# Patient Record
Sex: Male | Born: 1995 | Race: Black or African American | Hispanic: No | Marital: Single | Smoking: Current every day smoker
Health system: Southern US, Community
[De-identification: ages and names within clinical notes are randomized; demographics above are authoritative.]

---

## 2018-07-18 ENCOUNTER — Ambulatory Visit
Admission: EM | Admit: 2018-07-18 | Discharge: 2018-07-18 | Disposition: A | Discharge status: Home / Self Care | Payer: Federal, State, Local not specified - PPO | Attending: Physician Assistant | Admitting: Physician Assistant

## 2018-07-18 DIAGNOSIS — Z202 Contact with and (suspected) exposure to infections with a predominantly sexual mode of transmission: Secondary | ICD-10-CM

## 2018-07-18 NOTE — ED Provider Notes (Signed)
[name]    CSN: [id number] Arrival date & time: 07/18/18  1143     History   Chief Complaint Chief Complaint  Patient presents with  . SEXUALLY TRANSMITTED DISEASE    HPI Tyler Singleton is a 23 y.o. male.   23 year old male comes in for STD testing.  Patient states his partner went for a regular checkup, and was told to have an infection.  He is unsure what the infection is.  However, he is asymptomatic.  Denies urinary symptoms such as frequency, dysuria, hematuria.  Denies abdominal pain, nausea, vomiting.  Denies fever, chills, night sweats.  Denies penile discharge, penile lesion, testicular swelling, testicular pain.  Sexually active with one male partner, no condom use.     History reviewed. No pertinent past medical history.  There are no active problems to display for this patient.   History reviewed. No pertinent surgical history.     Home Medications    Prior to Admission medications   Not on File    Family History No family history on file.  Social History Social History   Tobacco Use  . Smoking status: Never Smoker  . Smokeless tobacco: Never Used  Substance Use Topics  . Alcohol use: Not Currently  . Drug use: Not on file     Allergies   Patient has no known allergies.   Review of Systems Review of Systems  Reason unable to perform ROS: See HPI as above.     Physical Exam Triage Vital Signs ED Triage Vitals  Enc Vitals Group     BP 07/18/18 1204 125/72     Pulse Rate 07/18/18 1204 85     Resp 07/18/18 1204 12     Temp 07/18/18 1204 98.6 F (37 C)     Temp Source 07/18/18 1204 Oral     SpO2 07/18/18 1204 99 %     Weight --      Height --      Head Circumference --      Peak Flow --      Pain Score 07/18/18 1212 0     Pain Loc --      Pain Edu? --      Excl. in [place]? --    No data found.  Updated Vital Signs BP 125/72 (BP Location: Left Arm)   Pulse 85   Temp 98.6 F (37 C) (Oral)   Resp 12   SpO2  99%   Physical Exam Constitutional:      General: He is not in acute distress.    Appearance: He is well-developed. He is not diaphoretic.  HENT:     Head: Normocephalic and atraumatic.  Eyes:     Conjunctiva/sclera: Conjunctivae normal.     Pupils: Pupils are equal, round, and reactive to light.  Neurological:     Mental Status: He is alert and oriented to person, place, and time.      UC Treatments / Results  Labs (all labs ordered are listed, but only abnormal results are displayed) Labs Reviewed  [place]    EKG   Radiology No results found.  Procedures Procedures (including critical care time)  Medications Ordered in UC Medications - No data to display  Initial Impression / Assessment and Plan / UC Course  I have reviewed the triage vital signs and the nursing notes.  Pertinent labs & imaging results that were available during my care of the patient were reviewed by me and considered  in my medical decision making (see chart for details).     Cytology sent, patient will be contacted with any positive results that require additional treatment. Patient to refrain from sexual activity for the next 7 days. Return precautions given.   Final Clinical Impressions(s) / UC Diagnoses   Final diagnoses:  Possible exposure to STD    ED Prescriptions    None       Belinda FisherYu, Miaisabella Bacorn V, PA-C 07/18/18 1254

## 2018-07-18 NOTE — Discharge Instructions (Signed)
Cytology sent, you will be contacted with any positive results that requires further treatment. Refrain from sexual activity for the next 7 days. If you have any testicular pain, penile lesion, follow up for reevaluation needed.

## 2018-07-19 LAB — URINE CYTOLOGY ANCILLARY ONLY
Chlamydia: POSITIVE — AB
Neisseria Gonorrhea: NEGATIVE
Trichomonas: NEGATIVE

## 2018-07-20 ENCOUNTER — Telehealth (HOSPITAL_COMMUNITY): Payer: Self-pay | Admitting: Emergency Medicine

## 2018-07-20 MED ORDER — AZITHROMYCIN 250 MG PO TABS: 1000.0000 mg | ORAL_TABLET | Freq: Once | ORAL | 0 refills | Status: AC

## 2018-07-20 NOTE — Telephone Encounter (Signed)
Chlamydia is positive.  Rx po zithromax 1g #1 dose no refills was sent to the pharmacy of record.  Pt needs education to please refrain from sexual intercourse for 7 days to give the medicine time to work, sexual partners need to be notified and tested/treated.  Condoms may reduce risk of reinfection.  Recheck or followup with PCP for further evaluation if symptoms are not improving.   [facility] notified.  Patient contacted and made aware of all results, all questions answered.    

## 2018-08-04 ENCOUNTER — Ambulatory Visit
Admission: EM | Admit: 2018-08-04 | Discharge: 2018-08-04 | Disposition: A | Discharge status: Home / Self Care | Payer: Federal, State, Local not specified - PPO | Attending: Emergency Medicine | Admitting: Emergency Medicine

## 2018-08-04 ENCOUNTER — Other Ambulatory Visit: Payer: Self-pay

## 2018-08-04 DIAGNOSIS — R369 Urethral discharge, unspecified: Secondary | ICD-10-CM | POA: Insufficient documentation

## 2018-08-04 MED ORDER — CEFTRIAXONE SODIUM 250 MG IJ SOLR
250.0000 mg | Freq: Once | INTRAMUSCULAR | Status: AC
  Administered 2018-08-04: 250 mg via INTRAMUSCULAR

## 2018-08-04 MED ORDER — AZITHROMYCIN 500 MG PO TABS
1000.0000 mg | ORAL_TABLET | Freq: Once | ORAL | Status: AC
  Administered 2018-08-04: 1000 mg via ORAL

## 2018-08-04 NOTE — Discharge Instructions (Addendum)
You were treated today for both chlamydia and gonorrhea. Avoid all forms of sexual intercourse (oral, vaginal, anal) for the next 7 days to avoid spreading/reinfecting. Return if symptoms worsen/do not resolve, you develop fever, abdominal pain, blood in your urine, or are re-exposed to an STI.

## 2018-08-04 NOTE — ED Provider Notes (Addendum)
[name]    CSN: [id number] Arrival date & time: 08/04/18  9381     History   Chief Complaint Chief Complaint  Patient presents with  . SEXUALLY TRANSMITTED DISEASE    HPI Tyler Singleton is a 23 y.o. male presenting for 1 day course of penile discharge without penile pain, swelling, testicular pain or swelling, abdominal pain.  Patient currently sexually active, with back to same male partner had previously when he tested positive for chlamydia.  Patient was treated for this earlier in July, though states that he was sexually active again with this partner since then, who states that she tested positive for chlamydia again.    History reviewed. No pertinent past medical history.  There are no active problems to display for this patient.   History reviewed. No pertinent surgical history.     Home Medications    Prior to Admission medications   Not on File    Family History No family history on file.  Social History Social History   Tobacco Use  . Smoking status: Never Smoker  . Smokeless tobacco: Never Used  Substance Use Topics  . Alcohol use: Not Currently  . Drug use: Not on file     Allergies   Patient has no known allergies.   Review of Systems Review of Systems  Constitutional: Negative for fatigue and fever.  Respiratory: Negative for cough and shortness of breath.   Cardiovascular: Negative for chest pain and palpitations.  Gastrointestinal: Negative for abdominal pain, diarrhea and vomiting.  Genitourinary: Positive for discharge. Negative for frequency, penile pain, penile swelling, scrotal swelling and testicular pain.  Musculoskeletal: Negative for arthralgias and myalgias.  Skin: Negative for rash and wound.  Neurological: Negative for speech difficulty and headaches.  All other systems reviewed and are negative.    Physical Exam Triage Vital Signs ED Triage Vitals  Enc Vitals Group     BP 08/04/18 0948 (!) 148/89      Pulse Rate 08/04/18 0948 85     Resp 08/04/18 0948 18     Temp 08/04/18 0948 98 F (36.7 C)     Temp Source 08/04/18 0948 Oral     SpO2 08/04/18 0948 99 %     Weight --      Height --      Head Circumference --      Peak Flow --      Pain Score 08/04/18 0949 0     Pain Loc --      Pain Edu? --      Excl. in [place]? --    No data found.  Updated Vital Signs BP (!) 148/89 (BP Location: Left Arm)   Pulse 85   Temp 98 F (36.7 C) (Oral)   Resp 18   SpO2 99%   Visual Acuity Right Eye Distance:   Left Eye Distance:   Bilateral Distance:    Right Eye Near:   Left Eye Near:    Bilateral Near:     Physical Exam Constitutional:      General: He is not in acute distress. HENT:     Head: Normocephalic and atraumatic.  Eyes:     General: No scleral icterus.    Pupils: Pupils are equal, round, and reactive to light.  Cardiovascular:     Rate and Rhythm: Normal rate.  Pulmonary:     Effort: Pulmonary effort is normal.  Skin:    Coloration: Skin is not jaundiced or pale.  Neurological:  Mental Status: He is alert and oriented to person, place, and time.      UC Treatments / Results  Labs (all labs ordered are listed, but only abnormal results are displayed) Labs Reviewed  URINE CYTOLOGY ANCILLARY ONLY    EKG   Radiology No results found.  Procedures Procedures (including critical care time)  Medications Ordered in UC Medications  azithromycin (ZITHROMAX) tablet 1,000 mg (has no administration in time range)  cefTRIAXone (ROCEPHIN) injection 250 mg (has no administration in time range)    Initial Impression / Assessment and Plan / UC Course  I have reviewed the triage vital signs and the nursing notes.  Pertinent labs & imaging results that were available during my care of the patient were reviewed by me and considered in my medical decision making (see chart for details).     1.  Penile discharge Patient with exposure to chlamydia, reporting  discharge: Given IM Rocephin and p.o. azithromycin which he tolerated well.  Discussed safe sex precautions as listed below.  Return precautions discussed, patient verbalized understanding and is agreeable to plan. Final Clinical Impressions(s) / UC Diagnoses   Final diagnoses:  Penile discharge     Discharge Instructions     You were treated today for both chlamydia and gonorrhea. Avoid all forms of sexual intercourse (oral, vaginal, anal) for the next 7 days to avoid spreading/reinfecting. Return if symptoms worsen/do not resolve, you develop fever, abdominal pain, blood in your urine, or are re-exposed to an STI.    ED Prescriptions    None     Controlled Substance Prescriptions [place] Controlled Substance Registry consulted? Not Applicable   Odette FractionHall-Potvin, [place], PA-C 08/04/18 1005    Hall-Potvin, [place], [place] JerseyPA-C 08/04/18 1006

## 2018-08-04 NOTE — ED Triage Notes (Signed)
Pt c/o penile discharge and states feels like it did last month when he was positive for STD

## 2018-08-06 ENCOUNTER — Emergency Department (HOSPITAL_COMMUNITY)
Admission: EM | Admit: 2018-08-06 | Discharge: 2018-08-06 | Disposition: A | Discharge status: Home / Self Care | Payer: Federal, State, Local not specified - PPO | Attending: Emergency Medicine | Admitting: Emergency Medicine

## 2018-08-06 ENCOUNTER — Encounter (HOSPITAL_COMMUNITY): Payer: Self-pay

## 2018-08-06 ENCOUNTER — Emergency Department (HOSPITAL_COMMUNITY): Payer: Federal, State, Local not specified - PPO

## 2018-08-06 ENCOUNTER — Other Ambulatory Visit: Payer: Self-pay

## 2018-08-06 DIAGNOSIS — S20212A Contusion of left front wall of thorax, initial encounter: Secondary | ICD-10-CM

## 2018-08-06 DIAGNOSIS — M79632 Pain in left forearm: Secondary | ICD-10-CM | POA: Insufficient documentation

## 2018-08-06 DIAGNOSIS — Y939 Activity, unspecified: Secondary | ICD-10-CM | POA: Insufficient documentation

## 2018-08-06 DIAGNOSIS — Y92410 Unspecified street and highway as the place of occurrence of the external cause: Secondary | ICD-10-CM | POA: Insufficient documentation

## 2018-08-06 DIAGNOSIS — F17290 Nicotine dependence, other tobacco product, uncomplicated: Secondary | ICD-10-CM | POA: Diagnosis not present

## 2018-08-06 DIAGNOSIS — Y999 Unspecified external cause status: Secondary | ICD-10-CM | POA: Insufficient documentation

## 2018-08-06 DIAGNOSIS — S29001A Unspecified injury of muscle and tendon of front wall of thorax, initial encounter: Secondary | ICD-10-CM | POA: Diagnosis present

## 2018-08-06 NOTE — ED Notes (Signed)
Patient verbalizes understanding of discharge instructions. Opportunity for questioning and answers were provided. Armband removed by staff, pt discharged from ED.  
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
## 2018-08-08 LAB — URINE CYTOLOGY ANCILLARY ONLY
Chlamydia: NEGATIVE
Neisseria Gonorrhea: NEGATIVE
Trichomonas: NEGATIVE

## 2018-08-13 ENCOUNTER — Other Ambulatory Visit: Payer: Self-pay

## 2018-08-13 ENCOUNTER — Emergency Department (HOSPITAL_COMMUNITY)
Admission: EM | Admit: 2018-08-13 | Discharge: 2018-08-13 | Disposition: A | Discharge status: Home / Self Care | Payer: Federal, State, Local not specified - PPO | Attending: Emergency Medicine | Admitting: Emergency Medicine

## 2018-08-13 ENCOUNTER — Encounter (HOSPITAL_COMMUNITY): Payer: Self-pay | Admitting: Emergency Medicine

## 2018-08-13 DIAGNOSIS — Y999 Unspecified external cause status: Secondary | ICD-10-CM | POA: Insufficient documentation

## 2018-08-13 DIAGNOSIS — Y92410 Unspecified street and highway as the place of occurrence of the external cause: Secondary | ICD-10-CM | POA: Diagnosis not present

## 2018-08-13 DIAGNOSIS — M546 Pain in thoracic spine: Secondary | ICD-10-CM | POA: Insufficient documentation

## 2018-08-13 DIAGNOSIS — Y9389 Activity, other specified: Secondary | ICD-10-CM | POA: Diagnosis not present

## 2018-08-13 DIAGNOSIS — F17290 Nicotine dependence, other tobacco product, uncomplicated: Secondary | ICD-10-CM | POA: Diagnosis not present

## 2018-08-13 DIAGNOSIS — M7918 Myalgia, other site: Secondary | ICD-10-CM

## 2018-08-13 MED ORDER — METHOCARBAMOL 500 MG PO TABS: 500.0000 mg | ORAL_TABLET | Freq: Two times a day (BID) | ORAL | 0 refills | Status: DC

## 2018-08-13 NOTE — Discharge Instructions (Signed)
Please read attached information. If you experience any new or worsening signs or symptoms please return to the emergency room for evaluation. Please follow-up with your primary care provider or specialist as discussed. Please use medication prescribed only as directed and discontinue taking if you have any concerning signs or symptoms.   °
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
## 2018-11-12 ENCOUNTER — Ambulatory Visit
Admission: EM | Admit: 2018-11-12 | Discharge: 2018-11-12 | Disposition: A | Discharge status: Home / Self Care | Payer: Federal, State, Local not specified - PPO | Attending: Physician Assistant | Admitting: Physician Assistant

## 2018-11-12 ENCOUNTER — Other Ambulatory Visit: Payer: Self-pay

## 2018-11-12 DIAGNOSIS — Z113 Encounter for screening for infections with a predominantly sexual mode of transmission: Secondary | ICD-10-CM | POA: Insufficient documentation

## 2018-11-12 NOTE — Discharge Instructions (Addendum)
Cytology sent, you will be contacted with any positive results that requires further treatment. Refrain from sexual activity for the next 7 days. If developing testicular swelling/pain, penile lesion/sore, follow up for reevaluation.   

## 2018-11-12 NOTE — ED Triage Notes (Signed)
Pt requesting a STD testing, denies any sx's

## 2018-11-12 NOTE — ED Provider Notes (Signed)
[name]    CSN: [id number] Arrival date & time: 11/12/18  0857      History   Chief Complaint Chief Complaint  Patient presents with  . SEXUALLY TRANSMITTED DISEASE    HPI Tyler Singleton is a 23 y.o. male.   23 year old male comes in for STD testing. He is asymptomatic, states his partner is having vaginal discharge and would like to be checked. Denies urinary symptoms such as frequency, dysuria, hematuria.  Denies penile discharge, penile lesion, testicular swelling, testicular pain.  Denies abdominal pain, nausea, vomiting.  Denies fever, chills, body aches.  He is sexually active with one male partner, no consistent condom use.     History reviewed. No pertinent past medical history.  There are no active problems to display for this patient.   History reviewed. No pertinent surgical history.     Home Medications    Prior to Admission medications   Not on File    Family History No family history on file.  Social History Social History   Tobacco Use  . Smoking status: Current Every Day Smoker    Types: Cigars  . Smokeless tobacco: Never Used  Substance Use Topics  . Alcohol use: Yes    Frequency: Never    Comment: occ  . Drug use: Yes    Types: Marijuana     Allergies   Patient has no known allergies.   Review of Systems Review of Systems  Reason unable to perform ROS: See HPI as above.     Physical Exam Triage Vital Signs ED Triage Vitals [11/12/18 0906]  Enc Vitals Group     BP (!) 145/86     Pulse Rate (!) 106     Resp 18     Temp (!) 97.5 F (36.4 C)     Temp Source Oral     SpO2 99 %     Weight      Height      Head Circumference      Peak Flow      Pain Score 0     Pain Loc      Pain Edu?      Excl. in GC?    No data found.  Updated Vital Signs BP (!) 145/86 (BP Location: Left Arm)   Pulse (!) 106   Temp (!) 97.5 F (36.4 C) (Oral)   Resp 18   SpO2 99%   Physical Exam Constitutional:    General: He is not in acute distress.    Appearance: He is well-developed. He is not diaphoretic.  HENT:     Head: Normocephalic and atraumatic.  Eyes:     Conjunctiva/sclera: Conjunctivae normal.     Pupils: Pupils are equal, round, and reactive to light.  Pulmonary:     Effort: Pulmonary effort is normal. No respiratory distress.  Neurological:     Mental Status: He is alert and oriented to person, place, and time.     UC Treatments / Results  Labs (all labs ordered are listed, but only abnormal results are displayed) Labs Reviewed  CYTOLOGY, (ORAL, ANAL, URETHRAL) ANCILLARY ONLY    EKG   Radiology No results found.  Procedures Procedures (including critical care time)  Medications Ordered in UC Medications - No data to display  Initial Impression / Assessment and Plan / UC Course  I have reviewed the triage vital signs and the nursing notes.  Pertinent labs & imaging results that were available during my care  of the patient were reviewed by me and considered in my medical decision making (see chart for details).    Patient became agitated from repeat reminder that masks should be worn throughout visit. He then became more agitated after being informed we no longer perform urine cytology. Discussed with patient we now only perform urethral cytology. After thought, patient agreed to urethral cytology. He self swabbed with RN monitoring. Cytology sent. Patient to refrain from sexual activity until testing results return. Return precautions given.  Final Clinical Impressions(s) / UC Diagnoses   Final diagnoses:  Screening for STD (sexually transmitted disease)   ED Prescriptions    None     PDMP not reviewed this encounter.   Ok Edwards, PA-C 11/12/18 1010

## 2018-11-14 LAB — CYTOLOGY, (ORAL, ANAL, URETHRAL) ANCILLARY ONLY
Chlamydia: POSITIVE — AB
Neisseria Gonorrhea: NEGATIVE
Trichomonas: NEGATIVE

## 2018-11-15 ENCOUNTER — Telehealth (HOSPITAL_COMMUNITY): Payer: Self-pay | Admitting: Emergency Medicine

## 2018-11-15 ENCOUNTER — Encounter (HOSPITAL_COMMUNITY): Payer: Self-pay

## 2018-11-15 MED ORDER — AZITHROMYCIN 250 MG PO TABS: 1000.0000 mg | ORAL_TABLET | Freq: Once | ORAL | 0 refills | Status: AC

## 2018-11-15 NOTE — Telephone Encounter (Signed)
Chlamydia is positive.  Rx po zithromax 1g #1 dose no refills was sent to the pharmacy of record.  Pt needs education to please refrain from sexual intercourse for 7 days to give the medicine time to work, sexual partners need to be notified and tested/treated.  Condoms may reduce risk of reinfection.  Recheck or followup with PCP for further evaluation if symptoms are not improving.   [facility] notified.  Attempted to reach patient. No answer at this time. Voicemail left.    

## 2018-11-16 ENCOUNTER — Telehealth: Payer: Self-pay | Admitting: Emergency Medicine

## 2018-11-16 NOTE — Telephone Encounter (Signed)
Pt read my mychart message, I contacted the pharmacy to see if he had picked up the treatment, pharmacy states he picked up zithromax yesterday

## 2019-02-04 ENCOUNTER — Other Ambulatory Visit: Payer: Self-pay

## 2019-02-04 ENCOUNTER — Ambulatory Visit
Admission: EM | Admit: 2019-02-04 | Discharge: 2019-02-04 | Disposition: A | Discharge status: Home / Self Care | Payer: Federal, State, Local not specified - PPO | Attending: Physician Assistant | Admitting: Physician Assistant

## 2019-02-04 DIAGNOSIS — J020 Streptococcal pharyngitis: Secondary | ICD-10-CM | POA: Diagnosis not present

## 2019-02-04 LAB — POCT RAPID STREP A (OFFICE): Rapid Strep A Screen: POSITIVE — AB

## 2019-02-04 MED ORDER — AMOXICILLIN 500 MG PO CAPS: 500.0000 mg | ORAL_CAPSULE | Freq: Two times a day (BID) | ORAL | 0 refills | Status: DC

## 2019-02-04 NOTE — Discharge Instructions (Signed)
Rapid strep positive. Start amoxicillin  as directed. Tylenol/Motrin for fever and pain. Follow up as scheduled for COVID testing. Monitor for any worsening of symptoms, trouble breathing, trouble swallowing, swelling of the throat, leaning forward to breath, drooling, follow up here or at the emergency department for reevaluation.

## 2019-02-04 NOTE — ED Provider Notes (Signed)
[name]    CSN: [id number] Arrival date & time: 02/04/19  1320      History   Chief Complaint Chief Complaint  Patient presents with  . Sore Throat    HPI Tyler Singleton is a 24 y.o. male.   24 year old male comes in for 3 day of sore throat. States hurts to swallow without trouble breathing, tripoding, drooling, trismus. Denies cough, rhinorrhea, nasal congestion. Denies fever, chills, body aches. Denies abdominal pain, nausea, vomiting, diarrhea. Denies shortness of breath, loss of taste/smell.      History reviewed. No pertinent past medical history.  There are no problems to display for this patient.   History reviewed. No pertinent surgical history.     Home Medications    Prior to Admission medications   Medication Sig Start Date End Date Taking? Authorizing Provider  amoxicillin (AMOXIL) 500 MG capsule Take 1 capsule (500 mg total) by mouth 2 (two) times daily. 02/04/19   Ok Edwards, PA-C    Family History History reviewed. No pertinent family history.  Social History Social History   Tobacco Use  . Smoking status: Current Every Day Smoker    Types: Cigars  . Smokeless tobacco: Never Used  Substance Use Topics  . Alcohol use: Yes    Comment: occ  . Drug use: Yes    Types: Marijuana     Allergies   Patient has no known allergies.   Review of Systems Review of Systems  Reason unable to perform ROS: See HPI as above.     Physical Exam Triage Vital Signs ED Triage Vitals  Enc Vitals Group     BP 02/04/19 1337 129/90     Pulse Rate 02/04/19 1337 72     Resp 02/04/19 1337 16     Temp 02/04/19 1337 98 F (36.7 C)     Temp Source 02/04/19 1337 Oral     SpO2 02/04/19 1337 99 %     Weight --      Height --      Head Circumference --      Peak Flow --      Pain Score 02/04/19 1338 7     Pain Loc --      Pain Edu? --      Excl. in [place]? --    No data found.  Updated Vital Signs BP 129/90 (BP Location: Left Arm)   Pulse  72   Temp 98 F (36.7 C) (Oral)   Resp 16   SpO2 99%   Visual Acuity Right Eye Distance:   Left Eye Distance:   Bilateral Distance:    Right Eye Near:   Left Eye Near:    Bilateral Near:     Physical Exam Constitutional:      General: He is not in acute distress.    Appearance: Normal appearance. He is not ill-appearing, toxic-appearing or diaphoretic.  HENT:     Head: Normocephalic and atraumatic.     Mouth/Throat:     Mouth: Mucous membranes are moist.     Pharynx: Oropharynx is clear. Uvula midline.     Tonsils: Tonsillar exudate present. 1+ on the right. 1+ on the left.     Comments: Erythematous uvula that is slightly swollen.  Cardiovascular:     Rate and Rhythm: Normal rate and regular rhythm.     Heart sounds: Normal heart sounds. No murmur. No friction rub. No gallop.   Pulmonary:     Effort: Pulmonary effort  is normal. No accessory muscle usage, prolonged expiration, respiratory distress or retractions.     Comments: Lungs clear to auscultation without adventitious lung sounds. Musculoskeletal:     Cervical back: Normal range of motion and neck supple.  Neurological:     General: No focal deficit present.     Mental Status: He is alert and oriented to person, place, and time.      UC Treatments / Results  Labs (all labs ordered are listed, but only abnormal results are displayed) Labs Reviewed  POCT RAPID STREP A (OFFICE) - Abnormal; Notable for the following components:      Result Value   Rapid Strep A Screen Positive (*)    All other components within normal limits    EKG   Radiology No results found.  Procedures Procedures (including critical care time)  Medications Ordered in UC Medications - No data to display  Initial Impression / Assessment and Plan / UC Course  I have reviewed the triage vital signs and the nursing notes.  Pertinent labs & imaging results that were available during my care of the patient were reviewed by me and  considered in my medical decision making (see chart for details).    Rapid strep positive. Start amoxicillin as directed. Discussed cannot rule out COVID causing symptoms as well, patient has scheduled rapid COVID testing this afternoon, will have patient follow up as scheduled. Symptomatic treatment as needed. Return precautions given.   Final Clinical Impressions(s) / UC Diagnoses   Final diagnoses:  Streptococcal sore throat   ED Prescriptions    Medication Sig Dispense Auth. Provider   amoxicillin (AMOXIL) 500 MG capsule Take 1 capsule (500 mg total) by mouth 2 (two) times daily. 20 capsule Belinda Fisher, PA-C     PDMP not reviewed this encounter.   Belinda Fisher, PA-C 02/04/19 1359

## 2019-02-04 NOTE — ED Triage Notes (Signed)
Pt c/o sore throat, hurts to swallow and swollen tonsil since Saturday night.

## 2019-04-04 ENCOUNTER — Ambulatory Visit: Payer: Federal, State, Local not specified - PPO | Attending: Family

## 2019-04-04 DIAGNOSIS — Z23 Encounter for immunization: Secondary | ICD-10-CM

## 2019-04-04 NOTE — Progress Notes (Signed)
   Covid-19 Vaccination Clinic  Name:  Bennie Chirico    MRN: [id number] DOB: January 21, 1995  04/04/2019  Mr. Ferrentino was observed post Covid-19 immunization for 15 minutes without incident. He was provided with Vaccine Information Sheet and instruction to access the V-Safe system.   Mr. Litke was instructed to call 911 with any severe reactions post vaccine: Marland Kitchen Difficulty breathing  . Swelling of face and throat  . A fast heartbeat  . A bad rash all over body  . Dizziness and weakness   Immunizations Administered    Name Date Dose VIS Date Route   Moderna COVID-19 Vaccine 04/04/2019  3:08 PM 0.5 mL 12/04/2018 Intramuscular   Manufacturer: Moderna   Lot: [id number]   NDC: 58307-460-02

## 2019-04-22 ENCOUNTER — Ambulatory Visit
Admission: EM | Admit: 2019-04-22 | Discharge: 2019-04-22 | Disposition: A | Discharge status: Home / Self Care | Payer: Federal, State, Local not specified - PPO | Attending: Emergency Medicine | Admitting: Emergency Medicine

## 2019-04-22 ENCOUNTER — Other Ambulatory Visit: Payer: Self-pay

## 2019-04-22 ENCOUNTER — Encounter: Payer: Self-pay | Admitting: Emergency Medicine

## 2019-04-22 DIAGNOSIS — Z7251 High risk heterosexual behavior: Secondary | ICD-10-CM | POA: Diagnosis not present

## 2019-04-22 DIAGNOSIS — R360 Urethral discharge without blood: Secondary | ICD-10-CM | POA: Diagnosis not present

## 2019-04-22 MED ORDER — DOXYCYCLINE HYCLATE 100 MG PO CAPS: 100.0000 mg | ORAL_CAPSULE | Freq: Two times a day (BID) | ORAL | 0 refills | Status: AC

## 2019-04-22 MED ORDER — CEFTRIAXONE SODIUM 500 MG IJ SOLR
500.0000 mg | Freq: Once | INTRAMUSCULAR | Status: AC
  Administered 2019-04-22: 500 mg via INTRAMUSCULAR

## 2019-04-22 NOTE — Discharge Instructions (Addendum)
Today you received treatment for chlamydia, gonorrhea. Testing for chlamydia, gonorrhea, trichomonas is pending: please look for these results on the MyChart app/website.  We will notify you if you are positive and outline treatment at that time.  Important to avoid all forms of sexual intercourse (oral, vaginal, anal) with any/all partners for the next 7 days to avoid spreading/reinfecting. Any/all sexual partners should be notified of testing/treatment today.  Return for persistent/worsening symptoms or if you develop fever, abdominal or pelvic pain, blood in your urine, or are re-exposed to an STI. 

## 2019-04-22 NOTE — ED Triage Notes (Signed)
seen by provider prior to this nurse

## 2019-04-22 NOTE — ED Provider Notes (Signed)
[name]    CSN: [id number] Arrival date & time: 04/22/19  1825      History   Chief Complaint Chief Complaint  Patient presents with  . Penile Discharge    HPI Tyler Singleton is a 24 y.o. male presenting for 1.5-week course of penile discharge without blood.  Denies penile or testicular pain, swelling.  No urinary symptoms such as frequency, dysuria.  Patient is currently sexually active with 1 male partner, not routinely wearing condoms.  Denies known exposure.  No abdominal, back pain, fever.    History reviewed. No pertinent past medical history.  There are no problems to display for this patient.   History reviewed. No pertinent surgical history.     Home Medications    Prior to Admission medications   Medication Sig Start Date End Date Taking? Authorizing Provider  doxycycline (VIBRAMYCIN) 100 MG capsule Take 1 capsule (100 mg total) by mouth 2 (two) times daily for 7 days. 04/22/19 04/29/19  Hall-Potvin, [place], PA-C    Family History No family history on file.  Social History Social History   Tobacco Use  . Smoking status: Current Every Day Smoker    Types: Cigars  . Smokeless tobacco: Never Used  Substance Use Topics  . Alcohol use: Yes    Comment: occ  . Drug use: Yes    Types: Marijuana     Allergies   Patient has no known allergies.   Review of Systems As per HPI   Physical Exam Triage Vital Signs ED Triage Vitals  Enc Vitals Group     BP      Pulse      Resp      Temp      Temp src      SpO2      Weight      Height      Head Circumference      Peak Flow      Pain Score      Pain Loc      Pain Edu?      Excl. in [place]?    No data found.  Updated Vital Signs BP 121/79 (BP Location: Right Arm)   Pulse 70   Temp 98.1 F (36.7 C) (Oral)   Resp 18   SpO2 97%   Visual Acuity Right Eye Distance:   Left Eye Distance:   Bilateral Distance:    Right Eye Near:   Left Eye Near:    Bilateral Near:      Physical Exam Constitutional:      General: He is not in acute distress. HENT:     Head: Normocephalic and atraumatic.  Eyes:     General: No scleral icterus.    Pupils: Pupils are equal, round, and reactive to light.  Cardiovascular:     Rate and Rhythm: Normal rate.  Pulmonary:     Effort: Pulmonary effort is normal. No respiratory distress.     Breath sounds: No wheezing.  Genitourinary:    Comments: Self swab performed Skin:    Coloration: Skin is not jaundiced or pale.  Neurological:     Mental Status: He is alert and oriented to person, place, and time.      UC Treatments / Results  Labs (all labs ordered are listed, but only abnormal results are displayed) Labs Reviewed  CYTOLOGY, (ORAL, ANAL, URETHRAL) ANCILLARY ONLY    EKG   Radiology No results found.  Procedures Procedures (including critical care time)  Medications  Ordered in UC Medications  cefTRIAXone (ROCEPHIN) injection 500 mg (500 mg Intramuscular Given 04/22/19 1937)    Initial Impression / Assessment and Plan / UC Course  I have reviewed the triage vital signs and the nursing notes.  Pertinent labs & imaging results that were available during my care of the patient were reviewed by me and considered in my medical decision making (see chart for details).     Patient appears well in office today.  Given discharge, will treat empirically for G/C.  Patient given Rocephin in office which he tolerated well.  Reviewed dosing of doxycycline as well as compliance and abstinence until treatment is completed.  Reviewed safe sex practices as outlined below.  Cytology pending: We will treat for trichomoniasis if he test positive.  Return precautions discussed, patient verbalized understanding and is agreeable to plan. Final Clinical Impressions(s) / UC Diagnoses   Final diagnoses:  Penile discharge, without blood  Unprotected sex     Discharge Instructions     Today you received treatment for  chlamydia, gonorrhea. Testing for chlamydia, gonorrhea, trichomonas is pending: please look for these results on the MyChart app/website.  We will notify you if you are positive and outline treatment at that time.  Important to avoid all forms of sexual intercourse (oral, vaginal, anal) with any/all partners for the next 7 days to avoid spreading/reinfecting. Any/all sexual partners should be notified of testing/treatment today.  Return for persistent/worsening symptoms or if you develop fever, abdominal or pelvic pain, blood in your urine, or are re-exposed to an STI.    ED Prescriptions    Medication Sig Dispense Auth. Provider   doxycycline (VIBRAMYCIN) 100 MG capsule Take 1 capsule (100 mg total) by mouth 2 (two) times daily for 7 days. 14 capsule Hall-Potvin, [place], PA-C     PDMP not reviewed this encounter.   Hall-Potvin, [place], [place] 04/23/19 [phone]

## 2019-04-24 LAB — CYTOLOGY, (ORAL, ANAL, URETHRAL) ANCILLARY ONLY
Chlamydia: POSITIVE — AB
Comment: NEGATIVE
Comment: NEGATIVE
Comment: NORMAL
Neisseria Gonorrhea: NEGATIVE
Trichomonas: NEGATIVE

## 2019-05-07 ENCOUNTER — Ambulatory Visit: Payer: Federal, State, Local not specified - PPO | Attending: Family

## 2019-05-07 DIAGNOSIS — Z23 Encounter for immunization: Secondary | ICD-10-CM

## 2019-05-07 NOTE — Progress Notes (Signed)
   Covid-19 Vaccination Clinic  Name:  Tyler Singleton    MRN: [id number] DOB: September 25, 1995  05/07/2019  [name] was observed post Covid-19 immunization for 15 minutes without incident. He was provided with Vaccine Information Sheet and instruction to access the V-Safe system.   [name] was instructed to call 911 with any severe reactions post vaccine: Marland Kitchen Difficulty breathing  . Swelling of face and throat  . A fast heartbeat  . A bad rash all over body  . Dizziness and weakness   Immunizations Administered    Name Date Dose VIS Date Route   Moderna COVID-19 Vaccine 05/07/2019 12:00 PM 0.5 mL 12/2018 Intramuscular   Manufacturer: Moderna   Lot: [id number]   NDC: 93818-299-37

## 2019-11-02 ENCOUNTER — Other Ambulatory Visit: Payer: Self-pay

## 2019-11-02 ENCOUNTER — Ambulatory Visit
Admission: EM | Admit: 2019-11-02 | Discharge: 2019-11-02 | Disposition: A | Discharge status: Home / Self Care | Payer: Federal, State, Local not specified - PPO | Attending: Physician Assistant | Admitting: Physician Assistant

## 2019-11-02 DIAGNOSIS — J039 Acute tonsillitis, unspecified: Secondary | ICD-10-CM | POA: Diagnosis present

## 2019-11-02 DIAGNOSIS — J029 Acute pharyngitis, unspecified: Secondary | ICD-10-CM | POA: Insufficient documentation

## 2019-11-02 LAB — POCT RAPID STREP A (OFFICE): Rapid Strep A Screen: NEGATIVE

## 2019-11-02 MED ORDER — LIDOCAINE VISCOUS HCL 2 % MT SOLN: OROMUCOSAL | 0 refills | Status: AC

## 2019-11-02 MED ORDER — AMOXICILLIN 500 MG PO CAPS: 500.0000 mg | ORAL_CAPSULE | Freq: Two times a day (BID) | ORAL | 0 refills | Status: DC

## 2019-11-02 NOTE — ED Provider Notes (Signed)
[name]    CSN: [id number] Arrival date & time: 11/02/19  1433      History   Chief Complaint Chief Complaint  Patient presents with  . Sore Throat    x 1 day    HPI Kaamil Morefield is a 24 y.o. male.   24 year male comes in for 2 day history of sore throat. Denies cough, rhinorrhea, nasal congestion. Denies fever, chills, body aches. Denies abdominal pain, nausea, vomiting, diarrhea. Denies shortness of breath, loss of taste/smell.       History reviewed. No pertinent past medical history.  There are no problems to display for this patient.   History reviewed. No pertinent surgical history.     Home Medications    Prior to Admission medications   Medication Sig Start Date End Date Taking? Authorizing Provider  amoxicillin (AMOXIL) 500 MG capsule Take 1 capsule (500 mg total) by mouth 2 (two) times daily. 11/02/19   Cathie Hoops, [place] Eveland V, PA-C  lidocaine (XYLOCAINE) 2 % solution 5-15 mL gargle as needed 11/02/19   Belinda Fisher, PA-C    Family History History reviewed. No pertinent family history.  Social History Social History   Tobacco Use  . Smoking status: Current Every Day Smoker    Types: Cigars  . Smokeless tobacco: Never Used  Vaping Use  . Vaping Use: Never used  Substance Use Topics  . Alcohol use: Yes    Comment: occ  . Drug use: Yes    Types: Marijuana     Allergies   Patient has no known allergies.   Review of Systems Review of Systems  Reason unable to perform ROS: See HPI as above.     Physical Exam Triage Vital Signs ED Triage Vitals  Enc Vitals Group     BP 11/02/19 1442 135/76     Pulse Rate 11/02/19 1442 98     Resp 11/02/19 1442 18     Temp 11/02/19 1442 98.4 F (36.9 C)     Temp Source 11/02/19 1442 Oral     SpO2 11/02/19 1442 99 %     Weight --      Height --      Head Circumference --      Peak Flow --      Pain Score 11/02/19 1443 7     Pain Loc --      Pain Edu? --      Excl. in [place]? --    No data  found.  Updated Vital Signs BP 135/76 (BP Location: Left Arm)   Pulse 98   Temp 98.4 F (36.9 C) (Oral)   Resp 18   SpO2 99%   Physical Exam Constitutional:      General: He is not in acute distress.    Appearance: Normal appearance. He is not ill-appearing, toxic-appearing or diaphoretic.  HENT:     Head: Normocephalic and atraumatic.     Mouth/Throat:     Mouth: Mucous membranes are moist.     Pharynx: Oropharynx is clear. Uvula midline. Posterior oropharyngeal erythema present.     Tonsils: Tonsillar exudate present. 2+ on the right. 2+ on the left.  Cardiovascular:     Rate and Rhythm: Normal rate and regular rhythm.     Heart sounds: Normal heart sounds. No murmur heard.  No friction rub. No gallop.   Pulmonary:     Effort: Pulmonary effort is normal. No accessory muscle usage, prolonged expiration, respiratory distress or retractions.  Comments: Lungs clear to auscultation without adventitious lung sounds. Musculoskeletal:     Cervical back: Normal range of motion and neck supple.  Neurological:     General: No focal deficit present.     Mental Status: He is alert and oriented to person, place, and time.      UC Treatments / Results  Labs (all labs ordered are listed, but only abnormal results are displayed) Labs Reviewed  CULTURE, GROUP A STREP ([facility])  NOVEL CORONAVIRUS, NAA  POCT RAPID STREP A (OFFICE)    EKG   Radiology No results found.  Procedures Procedures (including critical care time)  Medications Ordered in UC Medications - No data to display  Initial Impression / Assessment and Plan / UC Course  I have reviewed the triage vital signs and the nursing notes.  Pertinent labs & imaging results that were available during my care of the patient were reviewed by me and considered in my medical decision making (see chart for details).     Rapid strep negative.  However, given history and exam, will cover for tonsillitis with amoxicillin.  COVID testing ordered. Other symptomatic treatment discussed.  Return precautions given.  Patient expresses understanding and agrees to plan.  Final Clinical Impressions(s) / UC Diagnoses   Final diagnoses:  Sore throat  Acute tonsillitis, unspecified etiology    ED Prescriptions    Medication Sig Dispense Auth. Provider   amoxicillin (AMOXIL) 500 MG capsule Take 1 capsule (500 mg total) by mouth 2 (two) times daily. 20 capsule Treasa Bradshaw V, PA-C   lidocaine (XYLOCAINE) 2 % solution 5-15 mL gargle as needed 150 mL Belinda Fisher, PA-C     PDMP not reviewed this encounter.   Belinda Fisher, PA-C 11/02/19 1514

## 2019-11-02 NOTE — ED Triage Notes (Signed)
Pt states he has had a sore throat since yesterday and has it has worsened today. Pt is aox4 and ambulatory.

## 2019-11-02 NOTE — Discharge Instructions (Signed)
Rapid strep negative. However, given your exam, will cover you empirically for bacterial infection with amoxicillin. As discussed, symptoms can still be due to viral illness/ drainage down your throat. This usually takes 7-10 days to resolve. COVID testing ordered, please quarantine until testing results return.   Start lidocaine for sore throat, do not eat or drink for the next 40 mins after use as it can stunt your gag reflex. You can take over the counter allergy medicine such as Flonase, Zyrtec-D for nasal congestion/drainage. You can use over the counter nasal saline rinse such as neti pot for nasal congestion. Monitor for any worsening of symptoms, swelling of the throat, trouble breathing, trouble swallowing, leaning forward to breath, drooling, go to the emergency department for further evaluation needed.

## 2019-11-03 LAB — NOVEL CORONAVIRUS, NAA: SARS-CoV-2, NAA: NOT DETECTED

## 2019-11-03 LAB — SARS-COV-2, NAA 2 DAY TAT

## 2019-11-07 LAB — CULTURE, GROUP A STREP (THRC)

## 2019-12-04 ENCOUNTER — Other Ambulatory Visit: Payer: Self-pay

## 2019-12-04 ENCOUNTER — Ambulatory Visit
Admission: EM | Admit: 2019-12-04 | Discharge: 2019-12-04 | Disposition: A | Discharge status: Home / Self Care | Payer: Federal, State, Local not specified - PPO | Attending: Emergency Medicine | Admitting: Emergency Medicine

## 2019-12-04 DIAGNOSIS — Z113 Encounter for screening for infections with a predominantly sexual mode of transmission: Secondary | ICD-10-CM | POA: Diagnosis present

## 2019-12-04 NOTE — ED Provider Notes (Signed)
[name]    CSN: [id number] Arrival date & time: 12/04/19  1652      History   Chief Complaint Chief Complaint  Patient presents with  . SEXUALLY TRANSMITTED DISEASE    asymptomatic    HPI Tyler Singleton is a 24 y.o. male presenting today for STD screening.  Denies any symptoms or exposures.  Requesting screening in order to be safe with partner.  Declines HIV/syphilis testing.  HPI  History reviewed. No pertinent past medical history.  There are no problems to display for this patient.   History reviewed. No pertinent surgical history.     Home Medications    Prior to Admission medications   Medication Sig Start Date End Date Taking? Authorizing Provider  amoxicillin (AMOXIL) 500 MG capsule Take 1 capsule (500 mg total) by mouth 2 (two) times daily. 11/02/19   Cathie Hoops, Amy V, PA-C  lidocaine (XYLOCAINE) 2 % solution 5-15 mL gargle as needed 11/02/19   Belinda Fisher, PA-C    Family History History reviewed. No pertinent family history.  Social History Social History   Tobacco Use  . Smoking status: Current Every Day Smoker    Types: Cigars  . Smokeless tobacco: Never Used  Vaping Use  . Vaping Use: Never used  Substance Use Topics  . Alcohol use: Yes    Comment: occ  . Drug use: Yes    Types: Marijuana     Allergies   Patient has no known allergies.   Review of Systems Review of Systems  Constitutional: Negative for fever.  HENT: Negative for sore throat.   Respiratory: Negative for shortness of breath.   Cardiovascular: Negative for chest pain.  Gastrointestinal: Negative for abdominal pain, nausea and vomiting.  Genitourinary: Negative for difficulty urinating, discharge, dysuria, frequency, penile pain, penile swelling, scrotal swelling and testicular pain.  Skin: Negative for rash.  Neurological: Negative for dizziness, light-headedness and headaches.     Physical Exam Triage Vital Signs ED Triage Vitals  Enc Vitals Group      BP      Pulse      Resp      Temp      Temp src      SpO2      Weight      Height      Head Circumference      Peak Flow      Pain Score      Pain Loc      Pain Edu?      Excl. in GC?    No data found.  Updated Vital Signs BP 121/72 (BP Location: Left Arm)   Pulse 82   Temp 98.5 F (36.9 C) (Oral)   Resp 18   SpO2 98%   Visual Acuity Right Eye Distance:   Left Eye Distance:   Bilateral Distance:    Right Eye Near:   Left Eye Near:    Bilateral Near:     Physical Exam Vitals and nursing note reviewed.  Constitutional:      Appearance: He is well-developed.     Comments: No acute distress  HENT:     Head: Normocephalic and atraumatic.     Nose: Nose normal.  Eyes:     Conjunctiva/sclera: Conjunctivae normal.  Cardiovascular:     Rate and Rhythm: Normal rate.  Pulmonary:     Effort: Pulmonary effort is normal. No respiratory distress.  Abdominal:     General: There is no distension.  Musculoskeletal:  General: Normal range of motion.     Cervical back: Neck supple.  Skin:    General: Skin is warm and dry.  Neurological:     Mental Status: He is alert and oriented to person, place, and time.      UC Treatments / Results  Labs (all labs ordered are listed, but only abnormal results are displayed) Labs Reviewed  CYTOLOGY, (ORAL, ANAL, URETHRAL) ANCILLARY ONLY    EKG   Radiology No results found.  Procedures Procedures (including critical care time)  Medications Ordered in UC Medications - No data to display  Initial Impression / Assessment and Plan / UC Course  I have reviewed the triage vital signs and the nursing notes.  Pertinent labs & imaging results that were available during my care of the patient were reviewed by me and considered in my medical decision making (see chart for details).     Urethral swab pending for screening of gonorrhea chlamydia and trichomonas.  Deferring HIV and RPR per patient request.  Will call with  results and provide treatment if needed.  Discussed strict return precautions. Patient verbalized understanding and is agreeable with plan.  Final Clinical Impressions(s) / UC Diagnoses   Final diagnoses:  Screen for STD (sexually transmitted disease)     Discharge Instructions     We are testing you for Gonorrhea, Chlamydia and Trichomonas. We will call you if anything is positive and let you know if you require any further treatment. Please inform partner of any positive results.  Please return if symptoms not improving with treatment, development of fever, nausea, vomiting, abdominal pain, scrotal pain.    ED Prescriptions    None     PDMP not reviewed this encounter.   Lew Dawes, [place] 12/04/19 1819

## 2019-12-04 NOTE — ED Triage Notes (Signed)
Patient states he was asked to be checked by his girlfriend. Pt denies any exposure or symptoms.

## 2019-12-04 NOTE — Discharge Instructions (Signed)
We are testing you for Gonorrhea, Chlamydia and Trichomonas. We will call you if anything is positive and let you know if you require any further treatment. Please inform partner of any positive results.  Please return if symptoms not improving with treatment, development of fever, nausea, vomiting, abdominal pain, scrotal pain. 

## 2019-12-06 LAB — CYTOLOGY, (ORAL, ANAL, URETHRAL) ANCILLARY ONLY
Chlamydia: NEGATIVE
Comment: NEGATIVE
Comment: NEGATIVE
Comment: NORMAL
Neisseria Gonorrhea: NEGATIVE
Trichomonas: NEGATIVE

## 2020-08-12 ENCOUNTER — Other Ambulatory Visit: Payer: Self-pay

## 2020-08-12 ENCOUNTER — Encounter: Payer: Self-pay | Admitting: Emergency Medicine

## 2020-08-12 ENCOUNTER — Ambulatory Visit
Admission: EM | Admit: 2020-08-12 | Discharge: 2020-08-12 | Disposition: A | Discharge status: Home / Self Care | Payer: Federal, State, Local not specified - PPO | Attending: Urgent Care | Admitting: Urgent Care

## 2020-08-12 DIAGNOSIS — Z7251 High risk heterosexual behavior: Secondary | ICD-10-CM | POA: Insufficient documentation

## 2020-08-12 DIAGNOSIS — Z113 Encounter for screening for infections with a predominantly sexual mode of transmission: Secondary | ICD-10-CM | POA: Insufficient documentation

## 2020-08-12 NOTE — ED Triage Notes (Signed)
Patient requesting STD testing including HIV.  No sx's.

## 2020-08-12 NOTE — ED Provider Notes (Signed)
  [facility]   MRN: [id number] DOB: 03/14/1995  Subjective:   Tyler Singleton is a 25 y.o. male presenting for complete STI check.  Patient has a long-term relationship with his girlfriend of 4 years.  They have unprotected sex.  Unfortunately, recently she has had difficulty with frequent bacterial vaginosis infections.  Her testing for STIs has been negative.  However, patient wants to make sure that he gives her peace of mind as well. Denies dysuria, hematuria, urinary frequency, penile discharge, penile swelling, testicular pain, testicular swelling, anal pain, groin pain.   No current facility-administered medications for this encounter.  Current Outpatient Medications:    amoxicillin (AMOXIL) 500 MG capsule, Take 1 capsule (500 mg total) by mouth 2 (two) times daily., Disp: 20 capsule, Rfl: 0   lidocaine (XYLOCAINE) 2 % solution, 5-15 mL gargle as needed, Disp: 150 mL, Rfl: 0   No Known Allergies  History reviewed. No pertinent past medical history.   History reviewed. No pertinent surgical history.  No family history on file.  Social History   Tobacco Use   Smoking status: Every Day    Types: Cigars   Smokeless tobacco: Never  Vaping Use   Vaping Use: Never used  Substance Use Topics   Alcohol use: Yes    Comment: occ   Drug use: Yes    Types: Marijuana    ROS   Objective:   Vitals: BP 130/84 (BP Location: Left Arm)   Pulse 77   Temp 98.2 F (36.8 C) (Oral)   Ht 5\' 8"  (1.727 m)   Wt 173 lb (78.5 kg)   SpO2 98%   BMI 26.30 kg/m   Physical Exam Constitutional:      General: He is not in acute distress.    Appearance: Normal appearance. He is well-developed and normal weight. He is not ill-appearing, toxic-appearing or diaphoretic.  HENT:     Head: Normocephalic and atraumatic.     Right Ear: External ear normal.     Left Ear: External ear normal.     Nose: Nose normal.     Mouth/Throat:     Pharynx: Oropharynx is clear.  Eyes:      General: No scleral icterus.       Right eye: No discharge.        Left eye: No discharge.     Extraocular Movements: Extraocular movements intact.     Pupils: Pupils are equal, round, and reactive to light.  Cardiovascular:     Rate and Rhythm: Normal rate.  Pulmonary:     Effort: Pulmonary effort is normal.  Musculoskeletal:     Cervical back: Normal range of motion.  Skin:    General: Skin is warm and dry.  Neurological:     Mental Status: He is alert and oriented to person, place, and time.  Psychiatric:        Mood and Affect: Mood normal.        Behavior: Behavior normal.        Thought Content: Thought content normal.        Judgment: Judgment normal.    Assessment and Plan :   PDMP not reviewed this encounter.  1. Unprotected sex   2. Screen for STD (sexually transmitted disease)     STI labs pending. Recommended safe sex practices. Will treat as appropriate.    , Wallis Bamberg 08/12/20 1645

## 2020-08-13 LAB — CYTOLOGY, (ORAL, ANAL, URETHRAL) ANCILLARY ONLY
Chlamydia: NEGATIVE
Comment: NEGATIVE
Comment: NEGATIVE
Comment: NORMAL
Neisseria Gonorrhea: NEGATIVE
Trichomonas: NEGATIVE

## 2020-08-13 LAB — HIV ANTIBODY (ROUTINE TESTING W REFLEX): HIV Screen 4th Generation wRfx: NONREACTIVE

## 2020-08-13 LAB — RPR: RPR Ser Ql: NONREACTIVE
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
## 2021-07-31 ENCOUNTER — Encounter (HOSPITAL_COMMUNITY): Payer: Self-pay

## 2021-07-31 ENCOUNTER — Ambulatory Visit (HOSPITAL_COMMUNITY)
Admission: RE | Admit: 2021-07-31 | Discharge: 2021-07-31 | Disposition: A | Discharge status: Home / Self Care | Payer: Federal, State, Local not specified - PPO | Source: Ambulatory Visit | Attending: Internal Medicine | Admitting: Internal Medicine

## 2021-07-31 VITALS — BP 129/89 | HR 76 | Temp 98.5°F | Resp 16

## 2021-07-31 DIAGNOSIS — L0231 Cutaneous abscess of buttock: Secondary | ICD-10-CM | POA: Diagnosis not present

## 2021-07-31 MED ORDER — DOXYCYCLINE HYCLATE 100 MG PO CAPS: 100.0000 mg | ORAL_CAPSULE | Freq: Two times a day (BID) | ORAL | 0 refills | Status: AC

## 2021-07-31 NOTE — ED Provider Notes (Signed)
[facility]    CSN: [id number] Arrival date & time: 07/31/21  1112      History   Chief Complaint Chief Complaint  Patient presents with   Abscess    For some reason a boil continues to grow on my inner left buttocks , it comes and goes frequently and it causes pain. - Entered by patient    HPI Tyler Singleton is a 26 y.o. male.   Patient presents with possible abscess to left buttock that has been present for a few days.  Patient reports that it has been recurrent over the past few months.  He reports drainage at times.  Denies fever, body aches, chills.   Abscess   History reviewed. No pertinent past medical history.  There are no problems to display for this patient.   History reviewed. No pertinent surgical history.     Home Medications    Prior to Admission medications   Medication Sig Start Date End Date Taking? Authorizing Provider  doxycycline (VIBRAMYCIN) 100 MG capsule Take 1 capsule (100 mg total) by mouth 2 (two) times daily. 07/31/21  Yes Temple Sporer, Rolly Salter E, FNP  lidocaine (XYLOCAINE) 2 % solution 5-15 mL gargle as needed 11/02/19   Belinda Fisher, PA-C    Family History History reviewed. No pertinent family history.  Social History Social History   Tobacco Use   Smoking status: Every Day    Types: Cigars   Smokeless tobacco: Never  Vaping Use   Vaping Use: Never used  Substance Use Topics   Alcohol use: Yes    Comment: occ   Drug use: Yes    Types: Marijuana     Allergies   Patient has no known allergies.   Review of Systems Review of Systems Per HPI  Physical Exam Triage Vital Signs ED Triage Vitals [07/31/21 1122]  Enc Vitals Group     BP 129/89     Pulse Rate 76     Resp 16     Temp 98.5 F (36.9 C)     Temp Source Oral     SpO2 100 %     Weight      Height      Head Circumference      Peak Flow      Pain Score      Pain Loc      Pain Edu?      Excl. in GC?    No data found.  Updated Vital Signs BP 129/89  (BP Location: Left Arm)   Pulse 76   Temp 98.5 F (36.9 C) (Oral)   Resp 16   SpO2 100%   Visual Acuity Right Eye Distance:   Left Eye Distance:   Bilateral Distance:    Right Eye Near:   Left Eye Near:    Bilateral Near:     Physical Exam Exam conducted with a chaperone present.  Constitutional:      General: He is not in acute distress.    Appearance: Normal appearance. He is not toxic-appearing or diaphoretic.  HENT:     Head: Normocephalic and atraumatic.  Eyes:     Extraocular Movements: Extraocular movements intact.     Conjunctiva/sclera: Conjunctivae normal.  Pulmonary:     Effort: Pulmonary effort is normal.  Skin:    Comments: Approximately 1.5 cm in diameter indurated flesh-colored abscess present to left lower buttocks.  No drainage noted.  Neurological:     General: No focal deficit present.  Mental Status: He is alert and oriented to person, place, and time. Mental status is at baseline.  Psychiatric:        Mood and Affect: Mood normal.        Behavior: Behavior normal.        Thought Content: Thought content normal.        Judgment: Judgment normal.      UC Treatments / Results  Labs (all labs ordered are listed, but only abnormal results are displayed) Labs Reviewed - No data to display  EKG   Radiology No results found.  Procedures Procedures (including critical care time)  Medications Ordered in UC Medications - No data to display  Initial Impression / Assessment and Plan / UC Course  I have reviewed the triage vital signs and the nursing notes.  Pertinent labs & imaging results that were available during my care of the patient were reviewed by me and considered in my medical decision making (see chart for details).     Patient has abscess to left lower buttocks.  Will treat with doxycycline.  I&D not indicated given that it is indurated and not conducive to drainage.  Patient to continue warm compresses.  Patient to follow-up  with general surgery at provided contact information if symptoms persist or worsen given that it is a recurrent issue.  Discussed return precautions.  Patient verbalized understanding and was agreeable with plan. Final Clinical Impressions(s) / UC Diagnoses   Final diagnoses:  Abscess of left buttock     Discharge Instructions      You have an abscess on your bottom so you are being treated with an antibiotic.  Please continue warm compresses.  Please take medication with food as it can make you nauseous if you do not.  Please follow-up with general surgery at provided contact information if symptoms persist or worsen.    ED Prescriptions     Medication Sig Dispense Auth. Provider   doxycycline (VIBRAMYCIN) 100 MG capsule Take 1 capsule (100 mg total) by mouth 2 (two) times daily. 20 capsule Gustavus Bryant, [place]      PDMP not reviewed this encounter.   Gustavus Bryant, [place] 07/31/21 1144

## 2021-07-31 NOTE — ED Notes (Signed)
At bedside for [facility], NP exam of patient's abscess on buttocks

## 2021-07-31 NOTE — Discharge Instructions (Signed)
You have an abscess on your bottom so you are being treated with an antibiotic.  Please continue warm compresses.  Please take medication with food as it can make you nauseous if you do not.  Please follow-up with general surgery at provided contact information if symptoms persist or worsen.

## 2021-07-31 NOTE — ED Triage Notes (Signed)
Pt reports a boil between his legs. He reports this is ongoing issue for him. Pt reports using dial soap with no relief.
